# Patient Record
Sex: Male | Born: 1980
Health system: Southern US, Community
[De-identification: ages and names within clinical notes are randomized; demographics above are authoritative.]

## PROBLEM LIST (undated history)

## (undated) DIAGNOSIS — F329 Major depressive disorder, single episode, unspecified: Secondary | ICD-10-CM

## (undated) DIAGNOSIS — F32A Depression, unspecified: Secondary | ICD-10-CM

## (undated) HISTORY — PX: FRACTURE SURGERY: SHX138

## (undated) HISTORY — DX: Major depressive disorder, single episode, unspecified: F32.9

## (undated) HISTORY — DX: Depression, unspecified: F32.A

---

## 2008-10-12 HISTORY — PX: CHOLECYSTECTOMY: SHX55

## 2008-11-29 ENCOUNTER — Emergency Department (HOSPITAL_COMMUNITY): Admission: EM | Admit: 2008-11-29 | Discharge: 2008-11-30 | Payer: Self-pay | Admitting: Emergency Medicine

## 2008-12-03 ENCOUNTER — Ambulatory Visit (HOSPITAL_COMMUNITY): Admission: RE | Admit: 2008-12-03 | Discharge: 2008-12-03 | Payer: Self-pay | Admitting: Emergency Medicine

## 2009-09-16 IMAGING — US US ABDOMEN COMPLETE
1 series · 14 of 25 positions shown · non-contrast
Comparison: None

CLINICAL DATA: Epigastric pain after eating.

COMPLETE ABDOMINAL ULTRASOUND

[Series 1: us abdomen complete · 0.30mm/px · 14 of 89 slices shown]
[im 1/89]
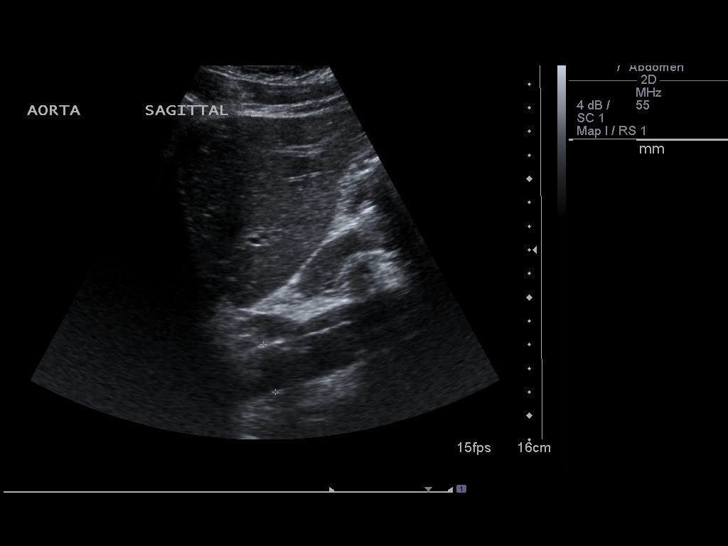
[im 8/89]
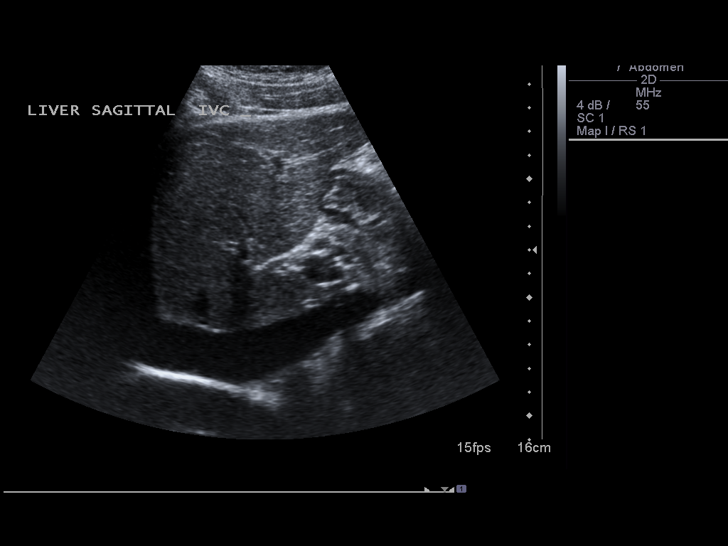
[im 15/89]
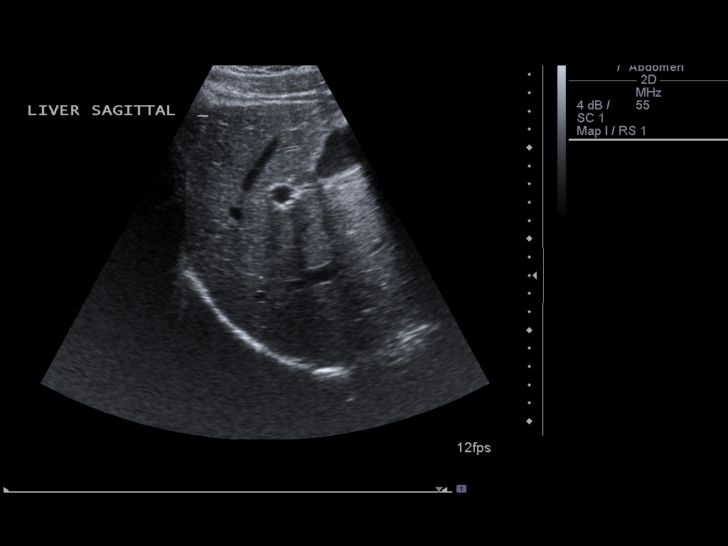
[im 23/89]
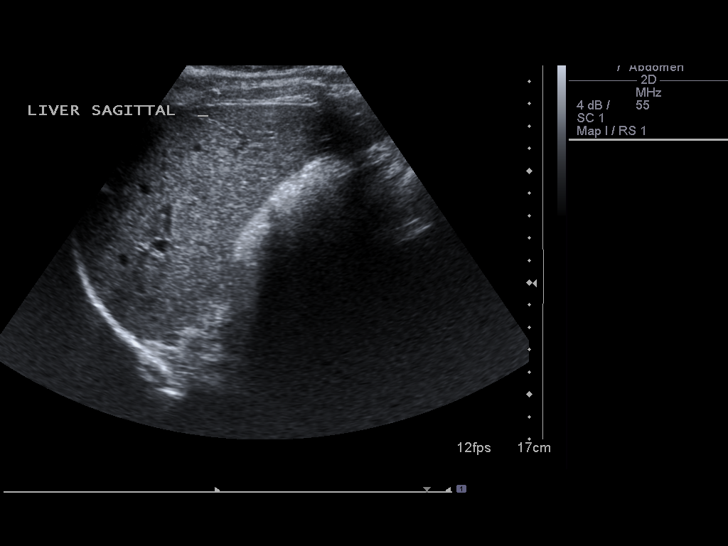
[im 30/89]
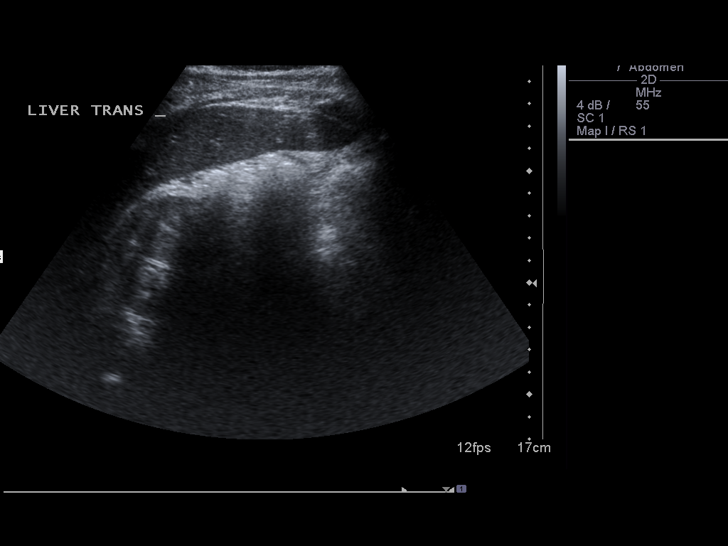
[im 34/89]
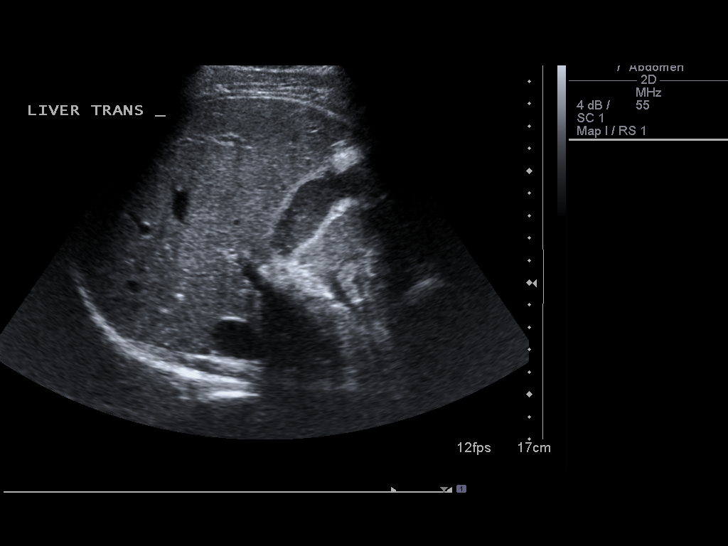
[im 41/89]
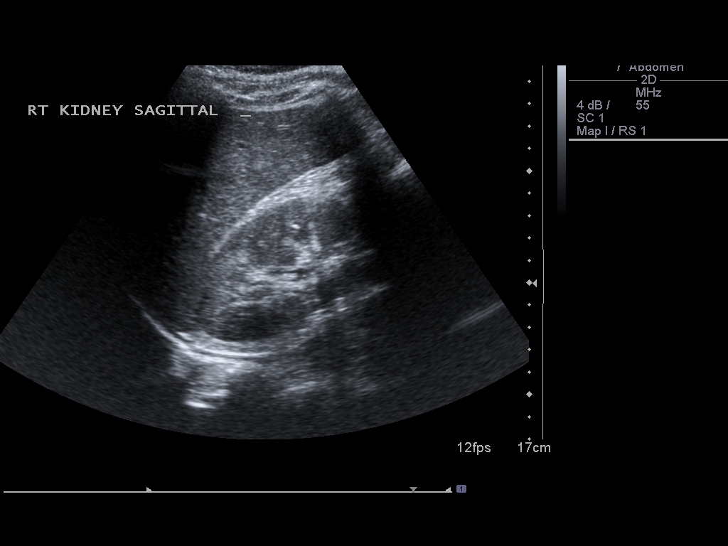
[im 48/89]
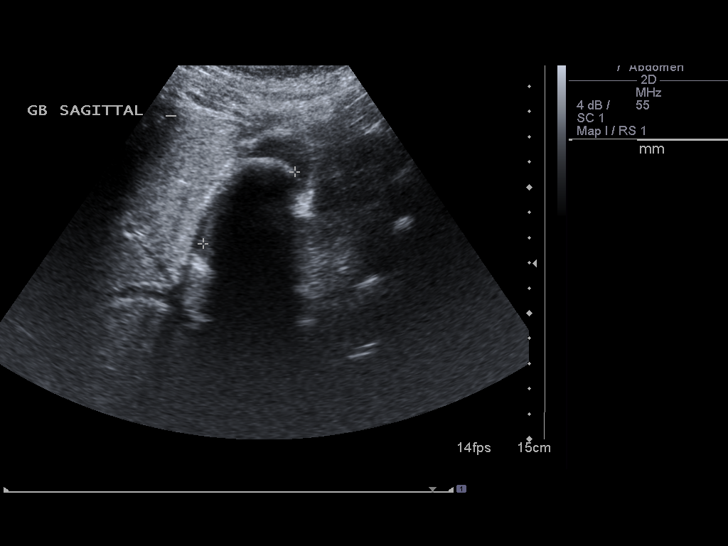
[im 56/89]
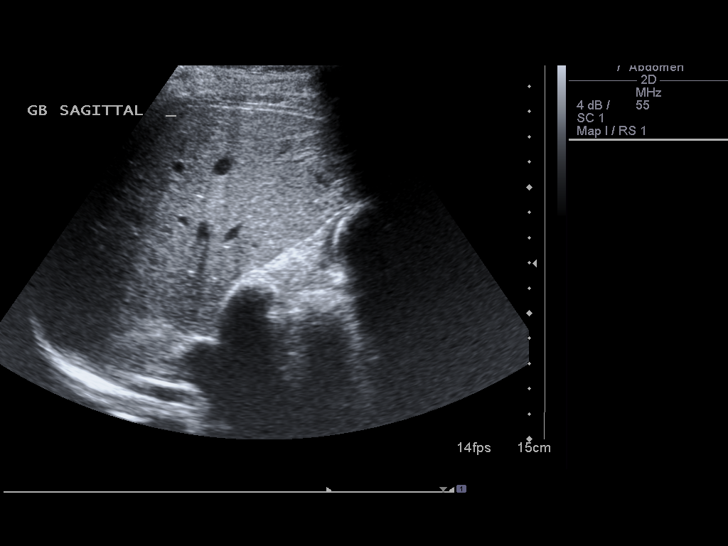
[im 59/89]
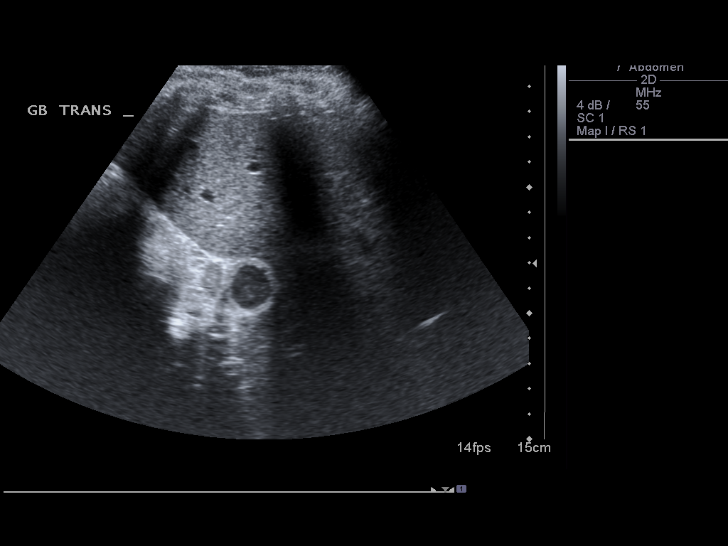
[im 67/89]
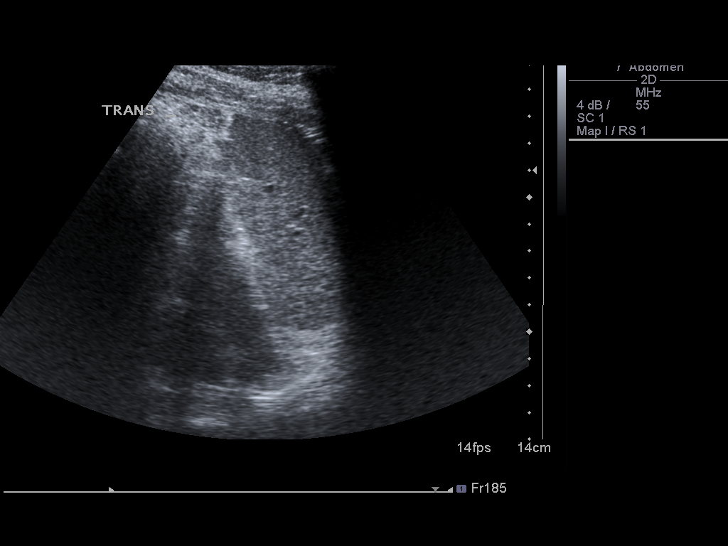
[im 74/89]
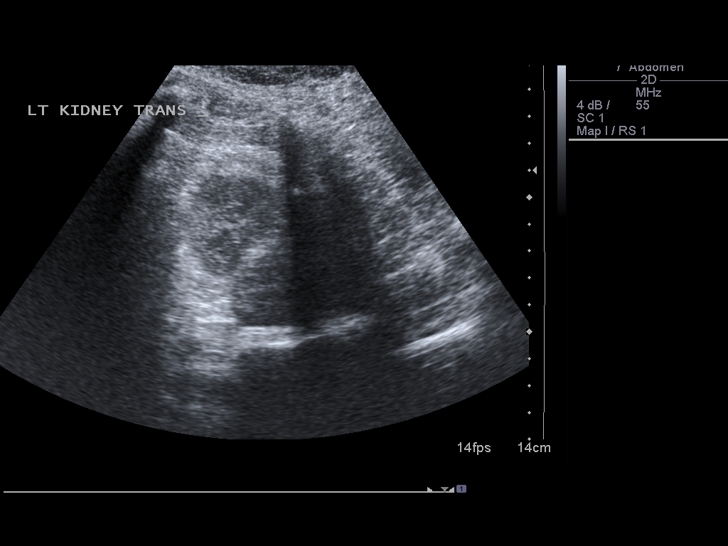
[im 81/89]
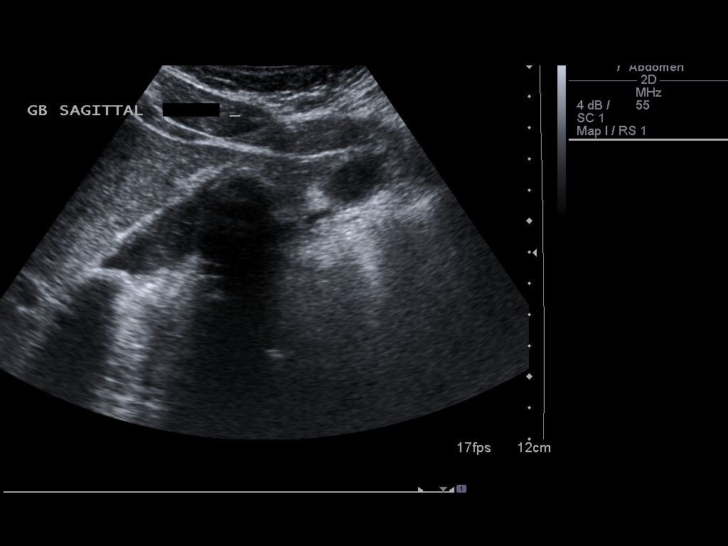
[im 89/89]
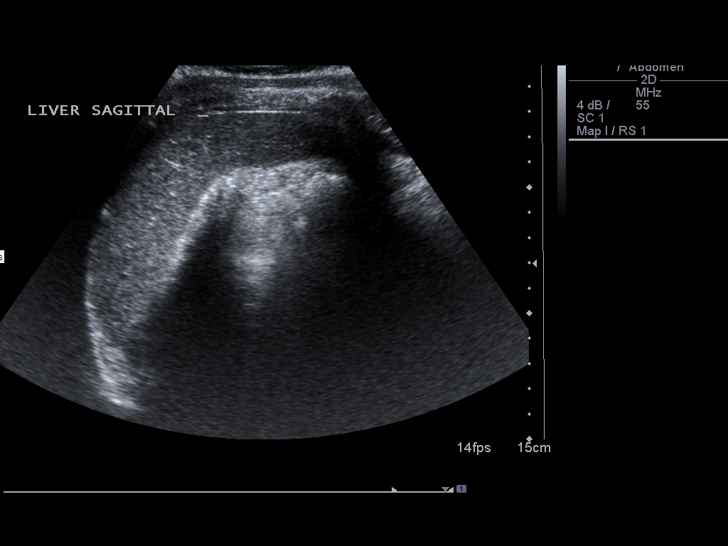

[14 of 25 positions shown; findings below may reference images not displayed]

FINDINGS: Gallbladder:  Numerous gallstones.  The largest measures 4.6 cm.
Gallbladder sludge.  No wall thickening, pericholecystic fluid, or
sonographic Murphy's sign.

Common bile duct:  Normal at 5 mm

Liver:  Normal in echogenicity, without focal lesion.

IVC:  Within normal limits.

Pancreas:  Within normal limits.

Spleen:  Normal in size and echogenicity.

Right Kidney:  10.0 cm

Left Kidney:  11.6 cm.  No hydronephrosis.

Abdominal aorta:  Nonaneurysmal without ascites.
IMPRESSION: Gallstones and gallbladder sludge without acute cholecystitis or
biliary ductal dilatation.

## 2011-10-19 ENCOUNTER — Ambulatory Visit (INDEPENDENT_AMBULATORY_CARE_PROVIDER_SITE_OTHER): Payer: 59 | Admitting: Family Medicine

## 2011-10-19 ENCOUNTER — Encounter: Payer: Self-pay | Admitting: Family Medicine

## 2011-10-19 VITALS — BP 112/66 | HR 65 | Temp 97.5°F | Resp 16 | Ht 70.0 in | Wt 198.0 lb

## 2011-10-19 DIAGNOSIS — F4310 Post-traumatic stress disorder, unspecified: Secondary | ICD-10-CM

## 2011-10-19 MED ORDER — DESVENLAFAXINE SUCCINATE ER 50 MG PO TB24: 50.0000 mg | ORAL_TABLET | Freq: Every day | ORAL | Status: AC

## 2011-10-19 NOTE — Progress Notes (Signed)
  Subjective:    Patient ID: Troy Munoz, male    DOB: Apr 12, 1981, 31 y.o.   MRN: [id number]  HPI    This 31 y.o. AA male has recently relocated to tge [place] area and works as a IT sales professional  He is a Cytogeneticist, honorably discharged form [employer] and diagnosed with PTSD in summer 2007.  He initially took Pristiq 50 mg with excellent results but discontinued the medication after 1 year   because he thought he was doing well. He requests the medication and is now back in counseling   once a week with Daiva Eves, LCSW (ph#: [phone]). He admits that his job is quite stressful  and he feels that the medication would help him improve his quality of life.    Review of Systems  Constitutional: Negative.   Respiratory: Negative.   Cardiovascular: Negative.   Gastrointestinal: Negative.   Genitourinary: Negative.   Neurological: Negative.   Psychiatric/Behavioral: Positive for sleep disturbance, dysphoric mood and decreased concentration. Negative for suicidal ideas, hallucinations and agitation.       Objective:   Physical Exam  Constitutional: He is oriented to person, place, and time. He appears well-developed and well-nourished. No distress.  HENT:  Head: Normocephalic and atraumatic.  Eyes: EOM are normal.  Cardiovascular: Normal rate.   Pulmonary/Chest: Effort normal.  Neurological: He is alert and oriented to person, place, and time. No cranial nerve deficit.  Psychiatric: He has a normal mood and affect. His behavior is normal. Judgment and thought content normal.          Assessment & Plan:   1. PTSD (post-traumatic stress disorder)      Resume Pristiq 50 mg every morning, continue counseling                                                                     RTC in 3 months for CPE

## 2011-11-24 ENCOUNTER — Ambulatory Visit (INDEPENDENT_AMBULATORY_CARE_PROVIDER_SITE_OTHER): Payer: 59 | Admitting: Family Medicine

## 2011-11-24 VITALS — BP 104/68 | HR 65 | Temp 98.2°F | Resp 16 | Ht 71.5 in | Wt 197.0 lb

## 2011-11-24 DIAGNOSIS — G44209 Tension-type headache, unspecified, not intractable: Secondary | ICD-10-CM

## 2011-11-24 DIAGNOSIS — R519 Headache, unspecified: Secondary | ICD-10-CM

## 2011-11-24 MED ORDER — TRAMADOL HCL 50 MG PO TABS: 50.0000 mg | ORAL_TABLET | Freq: Three times a day (TID) | ORAL | Status: AC | PRN
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
## 2011-11-24 NOTE — Patient Instructions (Signed)

## 2011-12-14 ENCOUNTER — Emergency Department (HOSPITAL_COMMUNITY)
Admission: EM | Admit: 2011-12-14 | Discharge: 2011-12-14 | Disposition: A | Discharge status: Home / Self Care | Payer: 59 | Attending: Emergency Medicine | Admitting: Emergency Medicine

## 2011-12-14 ENCOUNTER — Encounter (HOSPITAL_COMMUNITY): Payer: Self-pay | Admitting: Emergency Medicine

## 2011-12-14 DIAGNOSIS — R112 Nausea with vomiting, unspecified: Secondary | ICD-10-CM | POA: Insufficient documentation

## 2011-12-14 DIAGNOSIS — R197 Diarrhea, unspecified: Secondary | ICD-10-CM | POA: Insufficient documentation

## 2011-12-14 MED ORDER — ONDANSETRON 8 MG PO TBDP: 8.0000 mg | ORAL_TABLET | Freq: Once | ORAL | Status: DC

## 2011-12-14 MED ORDER — ONDANSETRON 8 MG PO TBDP: 8.0000 mg | ORAL_TABLET | Freq: Three times a day (TID) | ORAL | Status: AC | PRN

## 2011-12-14 MED ORDER — ONDANSETRON HCL 4 MG/2ML IJ SOLN: 4.0000 mg | Freq: Once | INTRAMUSCULAR | Status: DC

## 2011-12-14 MED ORDER — SODIUM CHLORIDE 0.9 % IV BOLUS (SEPSIS): 1000.0000 mL | Freq: Once | INTRAVENOUS | Status: DC

## 2011-12-14 NOTE — ED Provider Notes (Signed)
History     CSN: [id number]  Arrival date & time 12/14/11  4098   First MD Initiated Contact with Patient 12/14/11 [phone]      Chief Complaint  Patient presents with  . Vomiting and diarrhea     (Consider location/radiation/quality/duration/timing/severity/associated sxs/prior treatment) HPI This is a 31 year old black male who ate fish yesterday evening about 5 PM. He has no history of reactions to fish. Within an hour he began vomiting and has continued to have periodic vomiting, the most recent episode being about 2 hours ago. He has had some abdominal cramping with this but no diarrhea. He states his mouth is dry. He states the symptoms are improving but is still nauseated. There are moderate in severity. He has not taken anything for this.  History reviewed. No pertinent past medical history.  Past Surgical History  Procedure Date  . Cholecystectomy 10/2008    [place] [facility] - [facility] [place]    History reviewed. No pertinent family history.  History  Substance Use Topics  . Smoking status: Current Everyday Smoker -- 10 years    Types: Cigars  . Smokeless tobacco: Not on file   Comment: smoke 2 to 3 cigars weekly  . Alcohol Use: 0.0 oz/week     drink 3 beers or glasses of wine per week      Review of Systems  All other systems reviewed and are negative.    Allergies  Review of patient's allergies indicates no known allergies.  Home Medications   Current Outpatient Rx  Name Route Sig Dispense Refill  . DESVENLAFAXINE SUCCINATE ER 50 MG PO TB24 Oral Take 1 tablet (50 mg total) by mouth daily. 30 tablet 5    BP 129/62  Pulse 64  Temp(Src) 98.5 F (36.9 C) (Oral)  Resp 18  SpO2 100%  Physical Exam General: Well-developed, well-nourished male in no acute distress; appearance consistent with age of record HENT: normocephalic, atraumatic; dry mucous membranes Eyes: pupils equal round and reactive to light; extraocular muscles  intact Neck: supple Heart: regular rate and rhythm Lungs: clear to auscultation bilaterally Abdomen: soft; nondistended; mild diffuse tenderness; no masses or hepatosplenomegaly; bowel sounds present Extremities: No deformity; full range of motion Neurologic: Awake, alert and oriented; motor function intact in all extremities and symmetric; no facial droop Skin: Warm and dry Psychiatric: Normal mood and affect    ED Course  Procedures (including critical care time)     MDM  6:35 AM Patient states he feels better and does not wish any IV fluids at this time. He prefers to be treated with oral antiemetics.        Hanley Seamen, MD 12/14/11 [phone]

## 2011-12-14 NOTE — ED Notes (Signed)
In room to start IV and orders entered by MD. Pt states he is feeling a lot better and does not wish to have any IV fluids or mediations. Pt wishes to be discharged home. Will inform Dr Read Drivers.

## 2011-12-14 NOTE — ED Notes (Signed)
Pt reports he ate fish for dinner that was cooked at home. Pt reports after eating dinner he developed n/v and pain in a band across his abd right above his waist. Pt rocking during assessment. Pt reports last time he threw up it was just dry heaves. Pt talking in complete sentences with no difficulty at this time.

## 2011-12-14 NOTE — ED Notes (Signed)
Pt states he ate some fish for supper around 1700 and shortly after that he started having abd cramping then N/V/D

## 2012-01-11 ENCOUNTER — Encounter: Payer: 59 | Admitting: Family Medicine

## 2012-09-25 ENCOUNTER — Ambulatory Visit (INDEPENDENT_AMBULATORY_CARE_PROVIDER_SITE_OTHER): Payer: 59 | Admitting: Family Medicine

## 2012-09-25 ENCOUNTER — Encounter: Payer: Self-pay | Admitting: Family Medicine

## 2012-09-25 VITALS — BP 100/60 | HR 63 | Temp 97.9°F | Resp 16 | Ht 70.0 in | Wt 212.8 lb

## 2012-09-25 DIAGNOSIS — H6122 Impacted cerumen, left ear: Secondary | ICD-10-CM

## 2012-09-25 DIAGNOSIS — H6120 Impacted cerumen, unspecified ear: Secondary | ICD-10-CM

## 2012-09-25 DIAGNOSIS — H00019 Hordeolum externum unspecified eye, unspecified eyelid: Secondary | ICD-10-CM

## 2012-09-25 MED ORDER — ERYTHROMYCIN 5 MG/GM OP OINT: TOPICAL_OINTMENT | Freq: Every day | OPHTHALMIC | Status: AC

## 2012-09-25 MED ORDER — CEFUROXIME AXETIL 500 MG PO TABS: 500.0000 mg | ORAL_TABLET | Freq: Two times a day (BID) | ORAL | Status: DC

## 2012-09-25 NOTE — Progress Notes (Signed)
S:  This 32 y.o. AA male presents w/ left eye stye (1st occurrence). No other symptoms of URI, fever, sore throat or ear discomfort. Self-treatment consists of hot compresses w/ minimal drainage or improvement. Pt denies vision disturbance.  ROS: As per HPI.  O:  Filed Vitals:   09/25/12 1033  BP: 100/60  Pulse: 63  Temp: 97.9 F (36.6 C)  Resp: 16   GEN: In NAD; WN,WD. HEENT: [place]/AT; EOMI w/ injected conj (L>R); left lower lid w/ cystic lesion located near inner canthus. No active drainage. Left EAC occluded w/ cerumen. Nasal mucosa red and edematous. Post ph clear w/ minimal erythema. COR: RRR. LUNGS: Normal resp rate and effort. NEURO: A&O x 3; CNs intact. Nonfocal.  A/P:  1. Stye   2. Impacted cerumen of left ear    Left EAC clear after irrigation w/ mild erythema of canal. No Q-Tips!

## 2012-09-25 NOTE — Patient Instructions (Signed)
Sty  A sty (hordeolum) is an infection of a gland in the eyelid located at the base of the eyelash. A sty may develop a white or yellow head of pus. It can be puffy (swollen). Usually, the sty will burst and pus will come out on its own. They do not leave lumps in the eyelid once they drain.  A sty is often confused with another form of cyst of the eyelid called a chalazion. Chalazions occur within the eyelid and not on the edge where the bases of the eyelashes are. They often are red, sore and then form firm lumps in the eyelid.  CAUSES    Germs (bacteria).   Lasting (chronic) eyelid inflammation.  SYMPTOMS    Tenderness, redness and swelling along the edge of the eyelid at the base of the eyelashes.   Sometimes, there is a white or yellow head of pus. It may or may not drain.  DIAGNOSIS   An ophthalmologist will be able to distinguish between a sty and a chalazion and treat the condition appropriately.   TREATMENT    Styes are typically treated with warm packs (compresses) until drainage occurs.   In rare cases, medicines that kill germs (antibiotics) may be prescribed. These antibiotics may be in the form of drops, cream or pills.   If a hard lump has formed, it is generally necessary to do a small incision and remove the hardened contents of the cyst in a minor surgical procedure done in the office.   In suspicious cases, your caregiver may send the contents of the cyst to the lab to be certain that it is not a rare, but dangerous form of cancer of the glands of the eyelid.  HOME CARE INSTRUCTIONS    Wash your hands often and dry them with a clean towel. Avoid touching your eyelid. This may spread the infection to other parts of the eye.   Apply heat to your eyelid for 10 to 20 minutes, several times a day, to ease pain and help to heal it faster.   Do not squeeze the sty. Allow it to drain on its own. Wash your eyelid carefully 3 to 4 times per day to remove any pus.  SEEK IMMEDIATE MEDICAL CARE IF:     Your eye becomes painful or puffy (swollen).   Your vision changes.   Your sty does not drain by itself within 3 days.   Your sty comes back within a short period of time, even with treatment.   You have redness (inflammation) around the eye.   You have a fever.  Document Released: 06/07/2005 Document Revised: 11/20/2011 Document Reviewed: 02/09/2009  [employer] Patient Information 2013 [employer].

## 2016-10-16 DIAGNOSIS — R51 Headache: Secondary | ICD-10-CM | POA: Diagnosis not present

## 2016-12-05 DIAGNOSIS — H00022 Hordeolum internum right lower eyelid: Secondary | ICD-10-CM | POA: Diagnosis not present

## 2017-02-15 DIAGNOSIS — G4730 Sleep apnea, unspecified: Secondary | ICD-10-CM | POA: Diagnosis not present

## 2017-06-22 DIAGNOSIS — Z23 Encounter for immunization: Secondary | ICD-10-CM | POA: Diagnosis not present

## 2017-08-09 DIAGNOSIS — Z0000 Encounter for general adult medical examination without abnormal findings: Secondary | ICD-10-CM | POA: Diagnosis not present

## 2017-09-20 DIAGNOSIS — Z79899 Other long term (current) drug therapy: Secondary | ICD-10-CM | POA: Diagnosis not present

## 2018-02-14 DIAGNOSIS — M25561 Pain in right knee: Secondary | ICD-10-CM | POA: Diagnosis not present

## 2018-03-21 DIAGNOSIS — G8929 Other chronic pain: Secondary | ICD-10-CM | POA: Diagnosis not present

## 2018-03-21 DIAGNOSIS — M25561 Pain in right knee: Secondary | ICD-10-CM | POA: Diagnosis not present

## 2018-06-03 DIAGNOSIS — Z23 Encounter for immunization: Secondary | ICD-10-CM | POA: Diagnosis not present

## 2018-08-15 DIAGNOSIS — Z79899 Other long term (current) drug therapy: Secondary | ICD-10-CM | POA: Diagnosis not present

## 2018-08-16 DIAGNOSIS — M25562 Pain in left knee: Secondary | ICD-10-CM | POA: Diagnosis not present

## 2018-08-16 DIAGNOSIS — N529 Male erectile dysfunction, unspecified: Secondary | ICD-10-CM | POA: Diagnosis not present

## 2018-08-16 DIAGNOSIS — M25561 Pain in right knee: Secondary | ICD-10-CM | POA: Diagnosis not present

## 2018-12-03 DIAGNOSIS — Z7189 Other specified counseling: Secondary | ICD-10-CM | POA: Diagnosis not present

## 2019-04-30 ENCOUNTER — Other Ambulatory Visit: Payer: Self-pay

## 2019-04-30 ENCOUNTER — Encounter (HOSPITAL_COMMUNITY): Payer: Self-pay | Admitting: Emergency Medicine

## 2019-04-30 ENCOUNTER — Emergency Department (HOSPITAL_COMMUNITY)
Admission: EM | Admit: 2019-04-30 | Discharge: 2019-04-30 | Disposition: A | Discharge status: Home / Self Care | Payer: 59 | Attending: Emergency Medicine | Admitting: Emergency Medicine

## 2019-04-30 DIAGNOSIS — Z79899 Other long term (current) drug therapy: Secondary | ICD-10-CM | POA: Insufficient documentation

## 2019-04-30 DIAGNOSIS — K0889 Other specified disorders of teeth and supporting structures: Secondary | ICD-10-CM | POA: Insufficient documentation

## 2019-04-30 DIAGNOSIS — F17290 Nicotine dependence, other tobacco product, uncomplicated: Secondary | ICD-10-CM | POA: Insufficient documentation

## 2019-04-30 MED ORDER — HYDROCODONE-ACETAMINOPHEN 5-325 MG PO TABS
2.0000 | ORAL_TABLET | Freq: Once | ORAL | Status: AC
  Administered 2019-04-30: 2 via ORAL
  Filled 2019-04-30: qty 2

## 2019-04-30 MED ORDER — HYDROCODONE-ACETAMINOPHEN 5-325 MG PO TABS: 1.0000 | ORAL_TABLET | Freq: Two times a day (BID) | ORAL | 0 refills | Status: DC | PRN

## 2019-04-30 NOTE — Discharge Instructions (Addendum)
Prescription given for Norco. Take medication as directed and do not operate machinery, drive a car, or work while taking this medication as it can make you drowsy.   You were given a prescription for antibiotics. Please take the antibiotic prescription fully.   Please take the antibiotic prescription fully.   Please follow-up with a dentist in the next 5 to 7 days for reevaluation.  If you do not have a dentist, resources were provided for dentist in the area in your discharge summary.  Please contact one of the offices that are listed and make an appointment for follow-up.  Please return to the emergency department for any new or worsening symptoms.

## 2019-04-30 NOTE — ED Provider Notes (Signed)
[facility] EMERGENCY DEPARTMENT Provider Note   CSN: [id number] Arrival date & time: 04/30/19  4098    History   Chief Complaint Chief Complaint  Patient presents with  . Dental Pain    HPI Troy Munoz is a 38 y.o. male.     HPI  Pt is a 38 y/o male with a h/o depression who presents to the ED today for eval of right lower dental pain that started a few days ago.  Pain is constant and severe in nature.  He was seen by his dentist yesterday who stated that he needed a root canal but it cannot be scheduled until November.  He was placed on clindamycin which she states she has been compliant with.  He has been taking Tylenol and ibuprofen without relief.  States he had to leave work early because his pain was so severe.  Denies any fevers or other associated symptoms.  Past Medical History:  Diagnosis Date  . Depression     Patient Active Problem List   Diagnosis Date Noted  . PTSD (post-traumatic stress disorder) 10/19/2011    Past Surgical History:  Procedure Laterality Date  . CHOLECYSTECTOMY  10/2008   [place]  . FRACTURE SURGERY          Home Medications    Prior to Admission medications   Medication Sig Start Date End Date Taking? Authorizing Provider  cefUROXime (CEFTIN) 500 MG tablet Take 1 tablet (500 mg total) by mouth 2 (two) times daily. 09/25/12   Barton Fanny, MD  desvenlafaxine (PRISTIQ) 50 MG 24 hr tablet Take 1 tablet (50 mg total) by mouth daily. 10/19/11 10/18/12  Barton Fanny, MD  erythromycin [facility]) ophthalmic ointment Place into the left eye at bedtime. 09/25/12   Barton Fanny, MD  HYDROcodone-acetaminophen (NORCO/VICODIN) 5-325 MG tablet Take 1 tablet by mouth every 12 (twelve) hours as needed. 04/30/19   Shemeika Starzyk S, PA-C    Family History No family history on file.  Social History Social History   Tobacco Use  . Smoking status: Current  Every Day Smoker    Years: 10.00    Types: Cigars  . Tobacco comment: smoke 2 to 3 cigars weekly  Substance Use Topics  . Alcohol use: Yes    Comment: drink 3 beers or glasses of wine per week  . Drug use: No     Allergies   Patient has no known allergies.   Review of Systems Review of Systems  Constitutional: Negative for fever.  HENT: Positive for dental problem.      Physical Exam Updated Vital Signs BP 113/67 (BP Location: Right Arm)   Pulse 77   Temp 98 F (36.7 C) (Oral)   Resp 18   SpO2 95%   Physical Exam Constitutional:      General: He is not in acute distress.    Appearance: He is well-developed.  HENT:     Mouth/Throat:     Mouth: Mucous membranes are moist.     Pharynx: No oropharyngeal exudate or posterior oropharyngeal erythema.     Comments: Tooth #27 is tender to percussion.  There is tenderness along the gumline but no obvious fluctuance present.  No sublingual or submandibular swelling.  No trismus. Eyes:     Conjunctiva/sclera: Conjunctivae normal.  Cardiovascular:     Rate and Rhythm: Normal rate and regular rhythm.  Pulmonary:     Effort: Pulmonary effort is normal.  Breath sounds: Normal breath sounds.  Skin:    General: Skin is warm and dry.  Neurological:     Mental Status: He is alert and oriented to person, place, and time.      ED Treatments / Results  Labs (all labs ordered are listed, but only abnormal results are displayed) Labs Reviewed - No data to display  EKG None  Radiology No results found.  Procedures Procedures (including critical care time)  Medications Ordered in ED Medications  HYDROcodone-acetaminophen (NORCO/VICODIN) 5-325 MG per tablet 2 tablet (has no administration in time range)     Initial Impression / Assessment and Plan / ED Course  I have reviewed the triage vital signs and the nursing notes.  Pertinent labs & imaging results that were available during my care of the patient were  reviewed by me and considered in my medical decision making (see chart for details).      Final Clinical Impressions(s) / ED Diagnoses   Final diagnoses:  Pain, dental   Patient with toothache.  No gross abscess.  Exam unconcerning for Ludwig's angina or spread of infection.  He has already been evaluated by a dentist and started on clindamycin.  I advised rotating Tylenol, Motrin.  I gave him a very short course of pain medications.  Urged patient to follow-up with dentist.     ED Discharge Orders         Ordered    HYDROcodone-acetaminophen (NORCO/VICODIN) 5-325 MG tablet  Every 12 hours PRN     04/30/19 0844           Karrie MeresCouture, Coolidge Gossard S, PA-C 04/30/19 [id number]    Arby BarrettePfeiffer, Marcy, MD 05/21/19 1005

## 2019-04-30 NOTE — ED Triage Notes (Signed)
Patient with dental pain, needs a root canal.  Patient states he has been on clinda for one day, taking ibuprofen and APAP and still having pain.  He did see his dentist, needs root canal.  Can't get it till November.

## 2020-07-20 ENCOUNTER — Encounter (HOSPITAL_COMMUNITY): Payer: Self-pay

## 2020-07-20 ENCOUNTER — Emergency Department (HOSPITAL_COMMUNITY)
Admission: EM | Admit: 2020-07-20 | Discharge: 2020-07-20 | Disposition: A | Discharge status: Home / Self Care | Payer: 59 | Attending: Emergency Medicine | Admitting: Emergency Medicine

## 2020-07-20 ENCOUNTER — Other Ambulatory Visit: Payer: Self-pay

## 2020-07-20 DIAGNOSIS — K0889 Other specified disorders of teeth and supporting structures: Secondary | ICD-10-CM | POA: Insufficient documentation

## 2020-07-20 DIAGNOSIS — F17290 Nicotine dependence, other tobacco product, uncomplicated: Secondary | ICD-10-CM | POA: Insufficient documentation

## 2020-07-20 MED ORDER — HYDROCODONE-ACETAMINOPHEN 5-325 MG PO TABS: 1.0000 | ORAL_TABLET | Freq: Four times a day (QID) | ORAL | 0 refills | Status: AC | PRN

## 2020-07-20 MED ORDER — LIDOCAINE VISCOUS HCL 2 % MT SOLN
15.0000 mL | Freq: Once | OROMUCOSAL | Status: AC
  Administered 2020-07-20: 15 mL via OROMUCOSAL
  Filled 2020-07-20: qty 15

## 2020-07-20 MED ORDER — NAPROXEN 500 MG PO TABS
500.0000 mg | ORAL_TABLET | Freq: Once | ORAL | Status: AC
  Administered 2020-07-20: 500 mg via ORAL
  Filled 2020-07-20: qty 1

## 2020-07-20 MED ORDER — PENICILLIN V POTASSIUM 500 MG PO TABS: 500.0000 mg | ORAL_TABLET | Freq: Four times a day (QID) | ORAL | 0 refills | Status: AC

## 2020-07-20 MED ORDER — NAPROXEN 500 MG PO TABS: 500.0000 mg | ORAL_TABLET | Freq: Two times a day (BID) | ORAL | 0 refills | Status: AC

## 2020-07-20 MED ORDER — LIDOCAINE VISCOUS HCL 2 % MT SOLN: 15.0000 mL | OROMUCOSAL | 0 refills | Status: AC | PRN

## 2020-07-20 NOTE — Discharge Instructions (Addendum)
Today you were seen for dental pain.  If you start to experience and new or worsening symptoms return to the emergency department. If you start to experience fever, chills, neck stiffness/pain, or inability to move your neck or open your mouth come back to the emergency department immediately. If you begin to experience any blistering, rashes, swelling, or difficulty breathing seek medical care for evaluation of potentially more serious side effects.  Medications:  -I have prescribed you an antibiotic penicillin to treat the infection and Naproxen which is an anti-inflammatory medicine to treat the pain.  -Prescription for Norco sen to pharmacy. This is a narcotic for severe pain. Do not drive, work or drink alcohol while taking this medicine as it can make you drowsy. -Prescription sent to pharmacy for viscous lidocaine. This is to help numb your mouth and help with pain relief. You can use it every 3 hours  -You can also gargle warm salt water up to 5 times daily to remove bacteria from the mouth.   You can apply a cool compress for 15 to 20 minutes, but avoid applying heat to the area. -Continue usual home medications.  2. Follow Up: Keep follow up appointment scheduled with dentist    Be sure to eat something when taking the Naproxen as it can cause stomach upset and at worst stomach bleeding. Do not take additional non steroidal anti-inflammatory medicines such as Ibuprofen, Aleve, Advil, Mobic, Diclofenac, or goodie powder while taking Naproxen. You may supplement with Tylenol.

## 2020-07-20 NOTE — ED Provider Notes (Signed)
[facility]-EMERGENCY DEPT Provider Note   CSN: [id number] Arrival date & time: 07/20/20  1324     History Chief Complaint  Patient presents with  . Dental Pain    Troy Munoz is a 39 y.o. male with past medical history significant for depression, PTSD.  HPI Patient presents to emergency department today with chief complaint of dental pain x 2 days.  Patient states the pain is located in his right lower jaw.  He has a bridge there and states the pain radiates up to his right ear.  He states the pain is constant.  He describes the pain as sharp, throbbing and aching.  He rates the pain currently.  He tried taking Tylenol at home without symptom improvement.  He rates the pain 10 of 10 in severity.  He called his dentist who was able to see him in x4 days however patient did not think he could wait that long because of how bad the pain is.  He states he has had this bridge in place for 2 years. Denies fever, chills, voice change, inability to control secretions, nausea/vomiting, facial swelling, dysphagia, odynophagia, drainage or trauma   Past Medical History:  Diagnosis Date  . Depression     Patient Active Problem List   Diagnosis Date Noted  . PTSD (post-traumatic stress disorder) 10/19/2011    Past Surgical History:  Procedure Laterality Date  . CHOLECYSTECTOMY  10/2008   [place] [facility]. - [facility] [place]  . FRACTURE SURGERY         No family history on file.  Social History   Tobacco Use  . Smoking status: Current Every Day Smoker    Years: 10.00    Types: Cigars  . Smokeless tobacco: Never Used  . Tobacco comment: smoke 2 to 3 cigars weekly  Substance Use Topics  . Alcohol use: Yes    Comment: drink 3 beers or glasses of wine per week  . Drug use: No    Home Medications Prior to Admission medications   Medication Sig Start Date End Date Taking? Authorizing Provider  desvenlafaxine (PRISTIQ) 50 MG 24 hr tablet  Take 1 tablet (50 mg total) by mouth daily. 10/19/11 10/18/12  Maurice March, MD  erythromycin [facility]) ophthalmic ointment Place into the left eye at bedtime. 09/25/12   Maurice March, MD  HYDROcodone-acetaminophen (NORCO/VICODIN) 5-325 MG tablet Take 1 tablet by mouth every 6 (six) hours as needed for severe pain. 07/20/20   Walisiewicz, Yvonna Alanis E, PA-C  lidocaine (XYLOCAINE) 2 % solution Use as directed 15 mLs in the mouth or throat as needed for mouth pain. 07/20/20   Walisiewicz, Yvonna Alanis E, PA-C  naproxen (NAPROSYN) 500 MG tablet Take 1 tablet (500 mg total) by mouth 2 (two) times daily for 7 days. 07/20/20 07/27/20  Walisiewicz, Yvonna Alanis E, PA-C  penicillin v potassium (VEETID) 500 MG tablet Take 1 tablet (500 mg total) by mouth 4 (four) times daily for 5 days. 07/20/20 07/25/20  Shanon Ace, PA-C    Allergies    Patient has no known allergies.  Review of Systems   Review of Systems All other systems are reviewed and are negative for acute change except as noted in the HPI.  Physical Exam Updated Vital Signs BP (!) 157/100 (BP Location: Left Arm)   Pulse 74   Temp 98.4 F (36.9 C) (Oral)   Resp 15   Ht 5\' 10"  (1.778 m)   Wt 97.5 kg   SpO2  98%   BMI 30.85 kg/m   Physical Exam Vitals and nursing note reviewed.  Constitutional:      General: He is not in acute distress.    Appearance: He is not toxic-appearing.     Comments: Uncomfortable appearing  HENT:     Head: Normocephalic and atraumatic.     Nose: Nose normal.     Mouth/Throat:     Comments: Dentition appears to be stable. No noted area of swelling or fluctuance. No trismus. Mouth opening to at least 3 finger widths. Handles oral secretions without difficulty. External exam shows no asymmetry of the jaw line or face, no signs of obvious swelling or infection. No swelling or tenderness to the submental or submandibular regions. No swelling or tenderness into the soft tissues of the neck.Full  active range of motion of the jaw. Neck is supple with full active range of motion, no tenderness to palpation of the soft tissues.  Eyes:     General: No scleral icterus.       Right eye: No discharge.        Left eye: No discharge.     Conjunctiva/sclera: Conjunctivae normal.  Cardiovascular:     Rate and Rhythm: Normal rate and regular rhythm.     Pulses: Normal pulses.     Heart sounds: Normal heart sounds.  Pulmonary:     Effort: Pulmonary effort is normal.     Breath sounds: Normal breath sounds.  Abdominal:     General: There is no distension.  Musculoskeletal:        General: Normal range of motion.     Cervical back: Normal range of motion. No muscular tenderness.  Skin:    General: Skin is warm and dry.  Neurological:     Mental Status: He is oriented to person, place, and time.  Psychiatric:        Behavior: Behavior normal.     ED Results / Procedures / Treatments   Labs (all labs ordered are listed, but only abnormal results are displayed) Labs Reviewed - No data to display  EKG None  Radiology No results found.  Procedures Procedures (including critical care time)  Medications Ordered in ED Medications  naproxen (NAPROSYN) tablet 500 mg (has no administration in time range)  lidocaine (XYLOCAINE) 2 % viscous mouth solution 15 mL (has no administration in time range)    ED Course  I have reviewed the triage vital signs and the nursing notes.  Pertinent labs & imaging results that were available during my care of the patient were reviewed by me and considered in my medical decision making (see chart for details).    MDM Rules/Calculators/A&P                          History provided by patient with additional history obtained from chart review.    Pt is well appearing and is presenting with dental pain. Differential Diagnosis includes but is not limited to: toothache, abscess, periapical abscess, dental caries, cracked tooth, maxillary sinusitis,  gingivitis, gum hyperplasia, tooth fracture, tooth dislocation. On exam patient has chronic dental decay. Patient with toothache.  No gross abscess.  Exam unconcerning for Ludwig's angina or spread of infection.  Will treat with penicillin and anti-inflammatories medicine. Patient also given short course of norco for severe pain. I have reviewed the PDMP during this encounter. He has not recent narcotic prescriptions.  Urged patient to follow-up with dentist. VSS.  Pt  appears stable for d/c. Strict return precautions discussed.   Portions of this note were generated with Scientist, clinical (histocompatibility and immunogenetics). Dictation errors may occur despite best attempts at proofreading.    Final Clinical Impression(s) / ED Diagnoses Final diagnoses:  Pain, dental    Rx / DC Orders ED Discharge Orders         Ordered    penicillin v potassium (VEETID) 500 MG tablet  4 times daily        07/20/20 0713    HYDROcodone-acetaminophen (NORCO/VICODIN) 5-325 MG tablet  Every 6 hours PRN        07/20/20 0713    naproxen (NAPROSYN) 500 MG tablet  2 times daily        07/20/20 0713    lidocaine (XYLOCAINE) 2 % solution  As needed        07/20/20 0716           Shanon Ace, PA-C 07/20/20 1926    Margarita Grizzle, MD 07/21/20 1321

## 2020-07-20 NOTE — ED Triage Notes (Signed)
Pt reports right lower dental pain where bridge attaches.

## 2024-10-15 ENCOUNTER — Other Ambulatory Visit (HOSPITAL_BASED_OUTPATIENT_CLINIC_OR_DEPARTMENT_OTHER): Payer: Self-pay | Admitting: Family Medicine

## 2024-10-15 DIAGNOSIS — Z8249 Family history of ischemic heart disease and other diseases of the circulatory system: Secondary | ICD-10-CM
